# Patient Record
Sex: Male | Born: 1950 | Race: Black or African American | Hispanic: No | Marital: Single | Smoking: Never smoker
Health system: Southern US, Community
[De-identification: ages and names within clinical notes are randomized; demographics above are authoritative.]

## PROBLEM LIST (undated history)

## (undated) DIAGNOSIS — I10 Essential (primary) hypertension: Secondary | ICD-10-CM

---

## 2016-03-30 ENCOUNTER — Encounter (HOSPITAL_COMMUNITY): Payer: Self-pay

## 2016-03-30 ENCOUNTER — Emergency Department (HOSPITAL_COMMUNITY)
Admission: EM | Admit: 2016-03-30 | Discharge: 2016-03-30 | Disposition: A | Discharge status: Home / Self Care | Payer: Self-pay | Attending: Emergency Medicine | Admitting: Emergency Medicine

## 2016-03-30 ENCOUNTER — Emergency Department (HOSPITAL_COMMUNITY): Payer: Self-pay

## 2016-03-30 DIAGNOSIS — Z7982 Long term (current) use of aspirin: Secondary | ICD-10-CM | POA: Insufficient documentation

## 2016-03-30 DIAGNOSIS — I10 Essential (primary) hypertension: Secondary | ICD-10-CM | POA: Insufficient documentation

## 2016-03-30 DIAGNOSIS — N3091 Cystitis, unspecified with hematuria: Secondary | ICD-10-CM | POA: Insufficient documentation

## 2016-03-30 DIAGNOSIS — Z79899 Other long term (current) drug therapy: Secondary | ICD-10-CM | POA: Insufficient documentation

## 2016-03-30 HISTORY — DX: Essential (primary) hypertension: I10

## 2016-03-30 LAB — CBC WITH DIFFERENTIAL/PLATELET
BASOS ABS: 0 10*3/uL (ref 0.0–0.1)
Basophils Relative: 0 %
EOS PCT: 0 %
Eosinophils Absolute: 0.1 10*3/uL (ref 0.0–0.7)
HCT: 43.6 % (ref 39.0–52.0)
Hemoglobin: 15.3 g/dL (ref 13.0–17.0)
LYMPHS ABS: 2.4 10*3/uL (ref 0.7–4.0)
LYMPHS PCT: 11 %
MCH: 32.1 pg (ref 26.0–34.0)
MCHC: 35.1 g/dL (ref 30.0–36.0)
MCV: 91.6 fL (ref 78.0–100.0)
MONO ABS: 2.6 10*3/uL — AB (ref 0.1–1.0)
MONOS PCT: 12 %
Neutro Abs: 16.6 10*3/uL — ABNORMAL HIGH (ref 1.7–7.7)
Neutrophils Relative %: 77 %
PLATELETS: 157 10*3/uL (ref 150–400)
RBC: 4.76 MIL/uL (ref 4.22–5.81)
RDW: 14.2 % (ref 11.5–15.5)
WBC: 21.7 10*3/uL — ABNORMAL HIGH (ref 4.0–10.5)

## 2016-03-30 LAB — URINALYSIS, ROUTINE W REFLEX MICROSCOPIC
Bacteria, UA: NONE SEEN
SQUAMOUS EPITHELIAL / LPF: NONE SEEN

## 2016-03-30 LAB — COMPREHENSIVE METABOLIC PANEL
ALBUMIN: 2.9 g/dL — AB (ref 3.5–5.0)
ALK PHOS: 75 U/L (ref 38–126)
ALT: 33 U/L (ref 17–63)
ANION GAP: 6 (ref 5–15)
AST: 36 U/L (ref 15–41)
BUN: 11 mg/dL (ref 6–20)
CALCIUM: 8.9 mg/dL (ref 8.9–10.3)
CO2: 30 mmol/L (ref 22–32)
CREATININE: 1.39 mg/dL — AB (ref 0.61–1.24)
Chloride: 102 mmol/L (ref 101–111)
GFR calc Af Amer: >60 mL/min (ref >60)
GFR calc non Af Amer: 52 mL/min — ABNORMAL LOW (ref >60)
Glucose, Bld: 108 mg/dL — ABNORMAL HIGH (ref 65–99)
Potassium: 4.5 mmol/L (ref 3.5–5.1)
SODIUM: 138 mmol/L (ref 135–145)
Total Bilirubin: 1.9 mg/dL — ABNORMAL HIGH (ref 0.3–1.2)
Total Protein: 5.8 g/dL — ABNORMAL LOW (ref 6.5–8.1)

## 2016-03-30 LAB — I-STAT CHEM 8, ED
BUN: 12 mg/dL (ref 6–20)
CREATININE: 1.3 mg/dL — AB (ref 0.61–1.24)
Calcium, Ion: 1.14 mmol/L — ABNORMAL LOW (ref 1.15–1.40)
Chloride: 101 mmol/L (ref 101–111)
GLUCOSE: 107 mg/dL — AB (ref 65–99)
HCT: 46 % (ref 39.0–52.0)
HEMOGLOBIN: 15.6 g/dL (ref 13.0–17.0)
POTASSIUM: 4.5 mmol/L (ref 3.5–5.1)
Sodium: 139 mmol/L (ref 135–145)
TCO2: 26 mmol/L (ref 0–100)

## 2016-03-30 MED ORDER — CEPHALEXIN 250 MG PO CAPS: 250.0000 mg | ORAL_CAPSULE | Freq: Four times a day (QID) | ORAL | 0 refills | Status: AC

## 2016-03-30 MED ORDER — DEXTROSE 5 % IV SOLN
1.0000 g | Freq: Once | INTRAVENOUS | Status: AC
  Administered 2016-03-30: 1 g via INTRAVENOUS
  Filled 2016-03-30: qty 10

## 2016-03-30 MED ORDER — IOPAMIDOL (ISOVUE-300) INJECTION 61%
INTRAVENOUS | Status: AC
  Administered 2016-03-30: 100 mL
  Filled 2016-03-30: qty 100

## 2016-03-30 NOTE — ED Notes (Signed)
Patient transported to CT 

## 2016-03-30 NOTE — ED Triage Notes (Signed)
Pt. Here for bleeding from his penis. Pt. sts this started yesterday and is worse when he urinates. He also endorses dysuria. Pt. Also c/o left leg swelling, and left abd. Swelling. Pt. sts he has hx of kidney problems. Pt. Noted to have blood stained to underwear. Pt. Urine bloody as well.

## 2016-03-30 NOTE — Discharge Instructions (Signed)
It was so nice to meet you! You came into the emergency department with blood in your urine. We did a CT scan of your abdomen and pelvis, which showed that your bladder was very inflamed. We think this might be caused by a urinary tract infection. We gave you a dose of IV antibiotics. We will send you home with an antibiotic called Keflex that you should take four times a day for the next 7 days.   We also recommend that you call the [facility] at [phone] to schedule an appointment within the next week.

## 2016-03-30 NOTE — ED Provider Notes (Signed)
[facility]-EMERGENCY DEPT Provider Note   CSN: [id number] Arrival date & time: 03/30/16  0707   History   Chief Complaint Chief Complaint  Patient presents with  . Hematuria    HPI Jeffrey Dalton is a 65 y.o. male with a PMH of HTN and "kidney problems" presenting to the ED with hematuria. He is visiting from [place] and he arrived in the [place] on October 13th. Yesterday afternoon, he was urinating when he had sudden onset burning pain in his penis. He then noticed that he started urinating blood. Since yesterday afternoon, he has urinated blood every 2-3 minutes. He states that the blood is mostly just leaking out on its own. This has never happened to him before. He also has left sided abdominal swelling that has been going on for the last year. He has never seen a doctor for this. His left testicle is also swollen and mildly tender. He also notes bilateral lower extremity edema for the last few months. He feels like his left leg is more swollen that his right. He notes a fever yesterday. He states he found someone on the street who gave him three tablets of Amoxicillin, which he took every 8 hours yesterday. He otherwise takes Clonidine and Coreg for his hypertension. Not on any anticoagulation. He denies urinary retention.  HPI  Past Medical History:  Diagnosis Date  . Hypertension     There are no active problems to display for this patient.   History reviewed. No pertinent surgical history.    Home Medications    Prior to Admission medications   Medication Sig Start Date End Date Taking? Authorizing Provider  aspirin 81 MG chewable tablet Chew 81 mg by mouth daily.   Yes Historical Provider, MD  carvedilol (COREG) 6.25 MG tablet Take 6.25 mg by mouth 2 (two) times daily with a meal.   Yes Historical Provider, MD  cloNIDine (CATAPRES) 0.1 MG tablet Take 0.1 mg by mouth 2 (two) times daily.   Yes Historical Provider, MD  POTASSIUM PO Take 1 tablet by mouth 2 (two) times  daily.   Yes Historical Provider, MD  cephALEXin (KEFLEX) 250 MG capsule Take 1 capsule (250 mg total) by mouth 4 (four) times daily. 03/30/16   Campbell Stall, MD    Family History History reviewed. No pertinent family history.  Social History Social History  Substance Use Topics  . Smoking status: Never Smoker  . Smokeless tobacco: Never Used  . Alcohol use No     Allergies   Patient has no known allergies.   Review of Systems Review of Systems 10 Systems reviewed and are negative for acute change except as noted in the HPI.  Physical Exam Updated Vital Signs BP (!) 162/106   Pulse 83   Temp 98.5 F (36.9 C) (Oral)   Resp 16   Ht 6' 2.02" (1.88 m)   Wt 100 kg   SpO2 99%   BMI 28.29 kg/m   Physical Exam  Constitutional: He is oriented to person, place, and time. He appears well-developed and well-nourished. No distress.  HENT:  Head: Normocephalic and atraumatic.  Nose: Nose normal.  Eyes: Conjunctivae and EOM are normal.  Neck: Normal range of motion. Neck supple. No JVD present.  Cardiovascular: Normal rate and regular rhythm.  Exam reveals friction rub.   No murmur heard. Pulmonary/Chest: Effort normal and breath sounds normal. No respiratory distress.  Abdominal: There is no tenderness. There is no rebound and no guarding.  Left sided  abdominal swelling noted, no masses palpated, abdomen is distended but soft  Genitourinary: Penis normal.  Genitourinary Comments: Left testicle is edematous and mildly tender to palpation, right testicle normal  Musculoskeletal: Normal range of motion.  No CVA tenderness; 2+ pitting edema to the knee bilaterally  Neurological: He is alert and oriented to person, place, and time. No cranial nerve deficit.  Skin: Skin is warm and dry.  Psychiatric: He has a normal mood and affect. His behavior is normal. Thought content normal.  Nursing note and vitals reviewed.    ED Treatments / Results  Labs (all labs ordered are  listed, but only abnormal results are displayed) Labs Reviewed  COMPREHENSIVE METABOLIC PANEL - Abnormal; Notable for the following:       Result Value   Glucose, Bld 108 (*)    Creatinine, Ser 1.39 (*)    Total Protein 5.8 (*)    Albumin 2.9 (*)    Total Bilirubin 1.9 (*)    GFR calc non Af Amer 52 (*)    All other components within normal limits  CBC WITH DIFFERENTIAL/PLATELET - Abnormal; Notable for the following:    WBC 21.7 (*)    Neutro Abs 16.6 (*)    Monocytes Absolute 2.6 (*)    All other components within normal limits  URINALYSIS, ROUTINE W REFLEX MICROSCOPIC - Abnormal; Notable for the following:    Color, Urine RED (*)    APPearance TURBID (*)    Glucose, UA   (*)    Value: TEST NOT REPORTED DUE TO COLOR INTERFERENCE OF URINE PIGMENT   Hgb urine dipstick   (*)    Value: TEST NOT REPORTED DUE TO COLOR INTERFERENCE OF URINE PIGMENT   Bilirubin Urine   (*)    Value: TEST NOT REPORTED DUE TO COLOR INTERFERENCE OF URINE PIGMENT   Ketones, ur   (*)    Value: TEST NOT REPORTED DUE TO COLOR INTERFERENCE OF URINE PIGMENT   Protein, ur   (*)    Value: TEST NOT REPORTED DUE TO COLOR INTERFERENCE OF URINE PIGMENT   Nitrite   (*)    Value: TEST NOT REPORTED DUE TO COLOR INTERFERENCE OF URINE PIGMENT   Leukocytes, UA   (*)    Value: TEST NOT REPORTED DUE TO COLOR INTERFERENCE OF URINE PIGMENT   All other components within normal limits  I-STAT CHEM 8, ED - Abnormal; Notable for the following:    Creatinine, Ser 1.30 (*)    Glucose, Bld 107 (*)    Calcium, Ion 1.14 (*)    All other components within normal limits  URINE CULTURE    EKG  EKG Interpretation None       Radiology Ct Abdomen Pelvis W Contrast  Result Date: 03/30/2016 CLINICAL DATA:  Painful urination and hematuria starting yesterday EXAM: CT ABDOMEN AND PELVIS WITH CONTRAST TECHNIQUE: Multidetector CT imaging of the abdomen and pelvis was performed using the standard protocol following bolus  administration of intravenous contrast. CONTRAST:  100mL ISOVUE-300 IOPAMIDOL (ISOVUE-300) INJECTION 61% COMPARISON:  None. FINDINGS: Lower chest: Lung bases are normal. Hepatobiliary: Enhanced liver shows no focal mass. No calcified gallstones are noted within gallbladder. Pancreas: Enhanced pancreas shows no focal abnormality. No evidence of acute pancreatitis. Spleen: Enhanced spleen is normal. Adrenals/Urinary Tract: No adrenal gland mass. Enhanced kidneys are symmetrical in size. No hydronephrosis or hydroureter. Delayed renal images shows bilateral renal symmetrical excretion. Bilateral visualized proximal ureter is unremarkable. The urinary bladder is under distended. There is abnormal thickening  of urinary bladder wall up to 1.4 cm. Findings highly suspicious for cystitis. Clinical correlation is necessary. Stomach/Bowel: No small bowel obstruction. Mild fluid distended distal small bowel loops with some air-fluid levels without transition point in caliber of small bowel probable mild ileus. No pericecal inflammation. Normal appendix. Some colonic gas noted in mid sigmoid colon. Moderate stool noted within rectum. Some liquid stool noted within right colon. Diarrhea cannot be excluded. Vascular/Lymphatic: No aortic aneurysm. Mild atherosclerotic calcifications of common iliac arteries. No adenopathy Reproductive: Prostate gland and seminal vesicles are unremarkable. No inguinal adenopathy. Other: No ascites or free abdominal air. Musculoskeletal: No destructive bony lesions are noted. Sagittal images of the spine shows mild degenerative changes thoracolumbar spine. Mild disc space flattening with vacuum disc phenomenon and mild posterior disc bulge at L5-S1 level. IMPRESSION: 1. There is abnormal thickening of urinary bladder wall and mild enhancement of mucosa. Findings highly suspicious for cystitis. Clinical correlation is necessary. Limited assessment of urinary bladder which is under distended. 2. No  hydronephrosis or hydroureter. 3. Mild distended distal small bowel loops with some air-fluid level without transition point in caliber small bowel. Ileus or enteritis cannot be excluded. Some liquid stool noted in right colon suspicious for diarrhea. 4. No pericecal inflammation.  Normal appendix. 5. Some colonic gas noted in mid sigmoid colon. Moderate stool noted within rectum. 6. Degenerative changes lumbar spine. Electronically Signed   By: Natasha MeadLiviu  Pop M.D.   On: 03/30/2016 10:35    Procedures Procedures (including critical care time)  Medications Ordered in ED Medications  iopamidol (ISOVUE-300) 61 % injection (100 mLs  Contrast Given 03/30/16 1008)  cefTRIAXone (ROCEPHIN) 1 g in dextrose 5 % 50 mL IVPB (1 g Intravenous New Bag/Given 03/30/16 1113)     Initial Impression / Assessment and Plan / ED Course  I have reviewed the triage vital signs and the nursing notes.  Pertinent labs & imaging results that were available during my care of the patient were reviewed by me and considered in my medical decision making (see chart for details).  Clinical Course    Darin Engelsbraham is a 65 year old male with a PMH of HTN and "kidney problems" presenting to the ED with sudden onset hematuria and dysuria starting yesterday. He also has a left sided abdominal swelling for the last year and bilateral lower extremity edema for the last few months. Differential includes UTI, bladder malignancy, intrinsic kidney disease, PCKD. Think kidney stone is unlikely without any abdominal pain. He could have something unusual such as Schistosomiasis, although this is more common in areas such as [place] and the [place]. Another differential is sickle cell disease which can lead to papillary necrosis and hematuria, although Pt denies a history of this. Will start with basic lab work including CBC, CMP, UA and a CT abdomen/pelvis to evaluate further.  9:15AM: Initial labs with WBC 21, Hgb 15.3, Cr 1.39, UA with TNTC WBC  and RBCs (other values not reportable because urine too bloody). Will order CT abdomen/pelvis with contrast.  10:45AM: CT abd/pevis with abnormal thickening of the urinary bladder wall, highly suspicious for cystitis; no hydronephrosis; mild distended distal small bowel loops with some air-fluid level may be ileus or enteritis. Will send a urine culture and give Rocephin 1g x 1. Think this is likely infectious hemorrhagic cystitis. Hemoglobin is stable. Pt is able to urinate without difficulty. Patient is safe for discharge home with a course of Keflex and Urology follow-up.  Final Clinical Impressions(s) / ED Diagnoses  Final diagnoses:  Hemorrhagic cystitis    New Prescriptions New Prescriptions   CEPHALEXIN (KEFLEX) 250 MG CAPSULE    Take 1 capsule (250 mg total) by mouth 4 (four) times daily.     Campbell Stall, MD 03/30/16 1154    Cathren Laine, MD 03/30/16 [phone]

## 2016-03-30 NOTE — ED Notes (Signed)
EDP at bedside  

## 2016-04-01 LAB — URINE CULTURE

## 2016-04-02 ENCOUNTER — Telehealth (HOSPITAL_BASED_OUTPATIENT_CLINIC_OR_DEPARTMENT_OTHER): Payer: Self-pay | Admitting: Emergency Medicine

## 2016-04-02 NOTE — Telephone Encounter (Signed)
Post ED Visit - Positive Culture Follow-up: Successful Patient Follow-Up  Culture assessed and recommendations reviewed by: [x]  Enzo BiNathan Batchelder, Pharm.D. []  Celedonio MiyamotoJeremy Frens, Pharm.D., BCPS []  Garvin FilaMike Maccia, Pharm.D. []  Georgina PillionElizabeth Martin, Pharm.D., BCPS []  BridgmanMinh Pham, [address]Pharm[address]D., BCPS, AAHIVP []  Estella HuskMichelle Turner, Pharm.D., BCPS, AAHIVP []  Tennis Mustassie Stewart, Pharm.D. []  Sherle Poeob Vincent, [address]Pharm[address]  Positive urine culture  []  Patient discharged without antimicrobial prescription and treatment is now indicated [x]  Organism is resistant to prescribed ED discharge antimicrobial []  Patient with positive blood cultures  Changes discussed with ED provider: Terance HartKelly Gekas [facility] New antibiotic prescription stop Keflex. Start Nitrofurantoin 100mg  po bid x 7 days [facility] [address]  Contacted patient, 04/02/2016 1042   Berle MullMiller, Mamie Hundertmark 04/02/2016, 10:40 AM

## 2016-04-02 NOTE — Progress Notes (Signed)
ED Antimicrobial Stewardship Positive Culture Follow Up   Jeffrey Dalton is an [age] y.o. male who [facility] with a chief complaint of  Chief Complaint  Patient presents with  . Hematuria    Recent Results (from the past 720 hour(s))  Urine culture     Status: Abnormal   Collection Time: 03/30/16  8:05 AM  Result Value Ref Range Status   Specimen Description URINE, CLEAN CATCH  Final   Special Requests NONE  Final   Culture >=100,000 COLONIES/mL ESCHERICHIA COLI (A)  Final   Report Status 04/01/2016 FINAL  Final   Organism ID, Bacteria ESCHERICHIA COLI (A)  Final      Susceptibility   Escherichia coli - MIC*    AMPICILLIN >=32 RESISTANT Resistant     CEFAZOLIN >=64 RESISTANT Resistant     CEFTRIAXONE <=1 SENSITIVE Sensitive     CIPROFLOXACIN <=0.25 SENSITIVE Sensitive     GENTAMICIN >=16 RESISTANT Resistant     IMIPENEM <=0.25 SENSITIVE Sensitive     NITROFURANTOIN <=16 SENSITIVE Sensitive     TRIMETH/SULFA >=320 RESISTANT Resistant     AMPICILLIN/SULBACTAM >=32 RESISTANT Resistant     PIP/TAZO 8 SENSITIVE Sensitive     Extended ESBL NEGATIVE Sensitive     * >=100,000 COLONIES/mL ESCHERICHIA COLI    [x]  Treated with Keflex, organism resistant to prescribed antimicrobial  New antibiotic prescription: Nitrofurantoin 100mg  PO BID x 7 days  ED Provider: Terance HartKelly Gekas PA-C   Armandina StammerBATCHELDER,Alyx Mcguirk J 04/02/2016, 9:38 AM Infectious Diseases Pharmacist Phone# [phone]

## 2017-11-04 IMAGING — CT CT ABD-PELV W/ CM
2 of 5 series · 15 of 46 positions shown, 17 images · IV contrast (Omni 300)
Comparison: None.

CLINICAL DATA: Painful urination and hematuria starting yesterday

EXAM:
CT ABDOMEN AND PELVIS WITH CONTRAST
TECHNIQUE: Multidetector CT imaging of the abdomen and pelvis was performed
using the standard protocol following bolus administration of
intravenous contrast.
CONTRAST:  100mL [id number] IOPAMIDOL (3I456K-4AA) INJECTION 61%

[Series 2: a/p w/ 5mm · axial · 0.89mm/px · z∈[+911,+1366]mm · 12 of 103 slices shown, 14 images]
[im 6/103  soft-tissue]
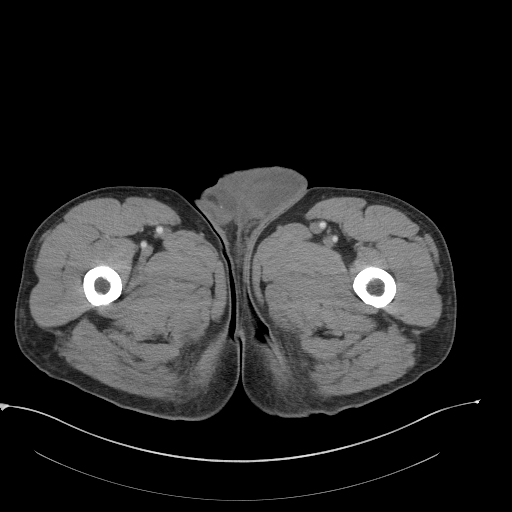
[im 6/103  bone]
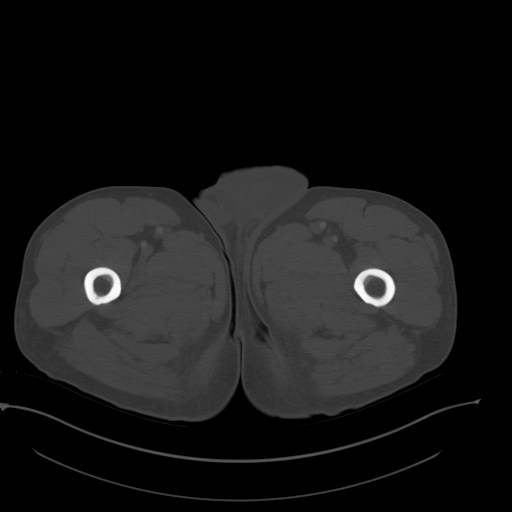
[im 17/103  soft-tissue]
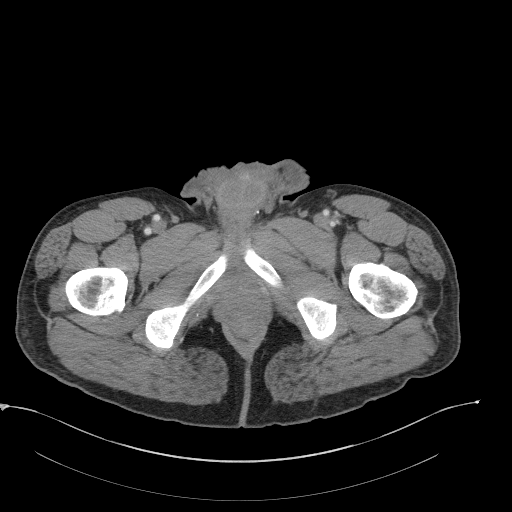
[im 22/103  soft-tissue]
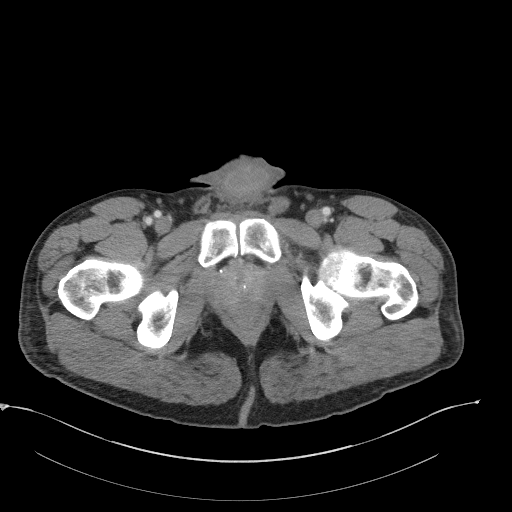
[im 33/103  soft-tissue]
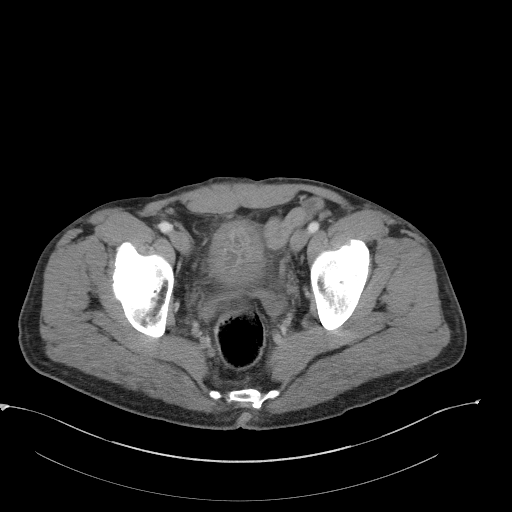
[im 38/103  soft-tissue]
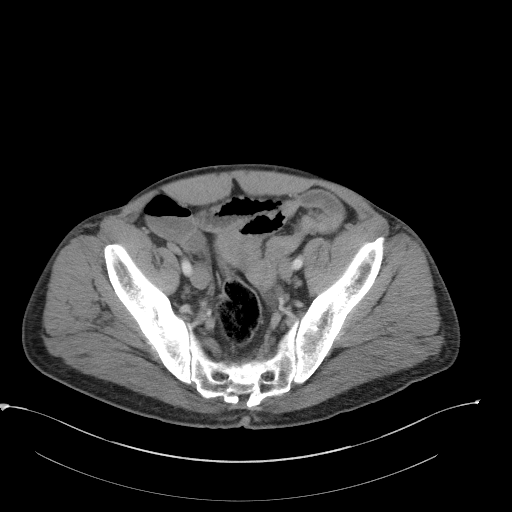
[im 49/103  soft-tissue]
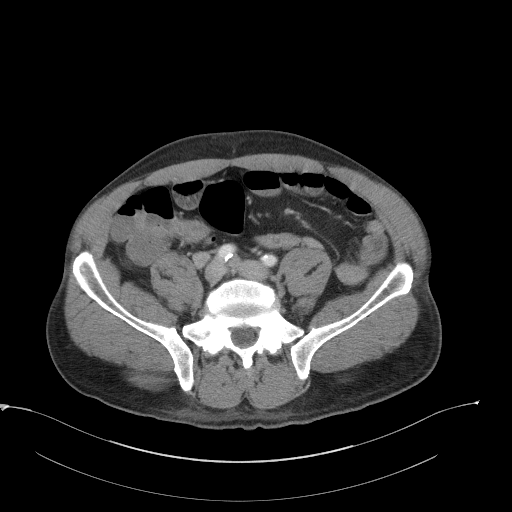
[im 54/103  soft-tissue]
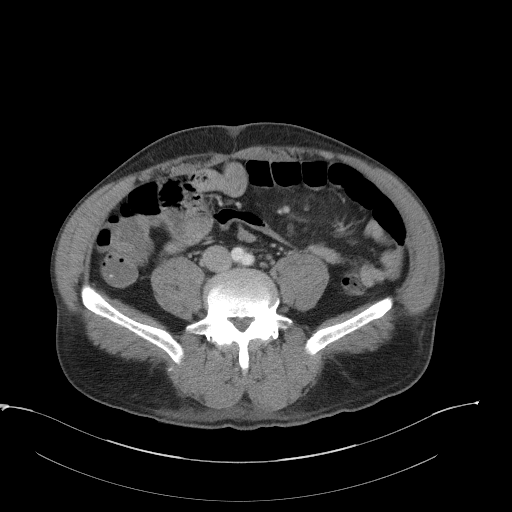
[im 65/103  soft-tissue]
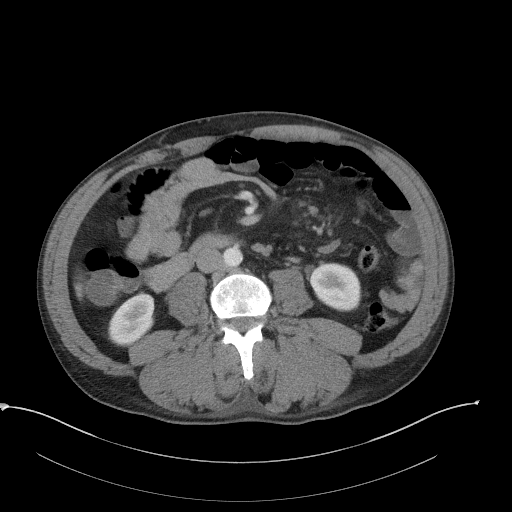
[im 70/103  soft-tissue]
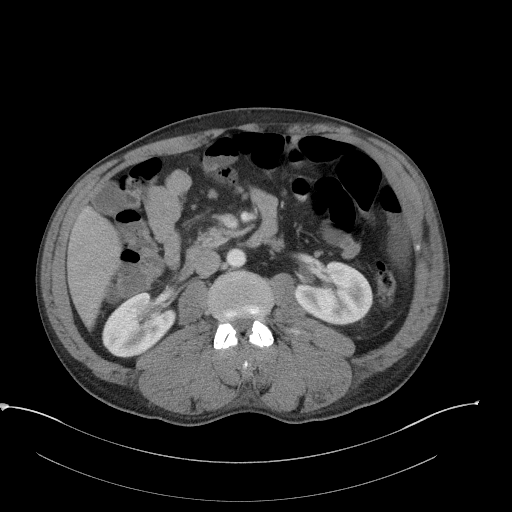
[im 70/103  bone]
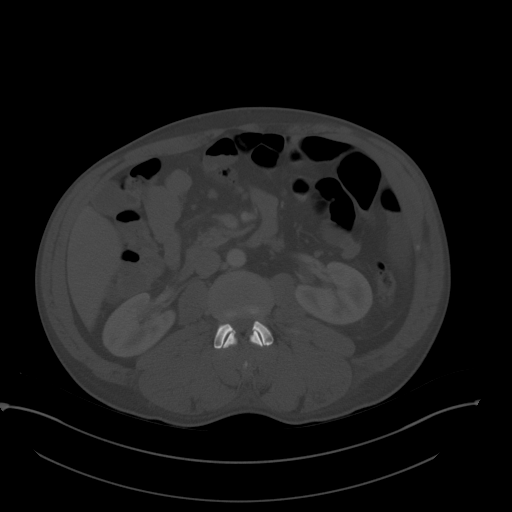
[im 81/103  soft-tissue]
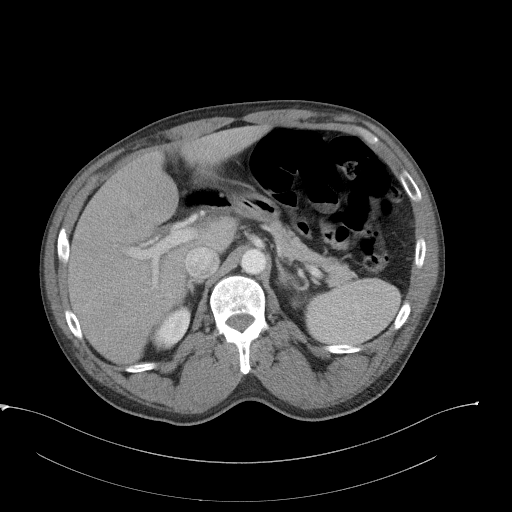
[im 86/103  soft-tissue]
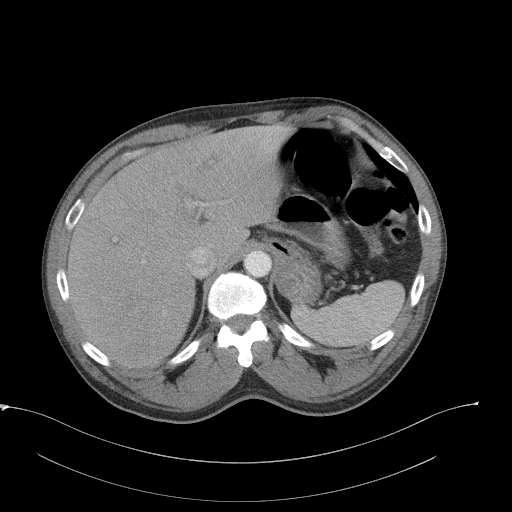
[im 97/103  soft-tissue]
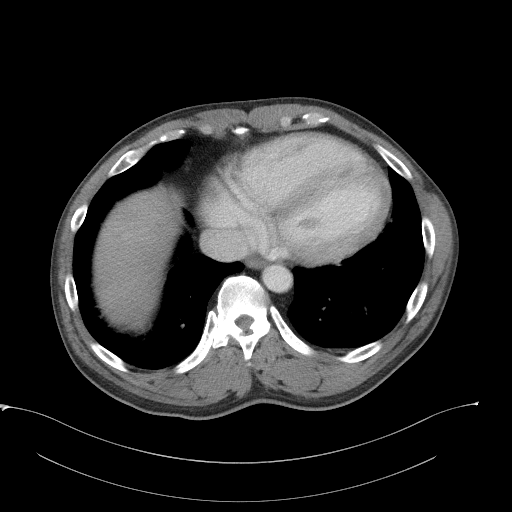

[Series 5: a/p w/ cor · coronal · 0.84mm/px · 3 of 140 slices shown]
[im 47/140  soft-tissue]
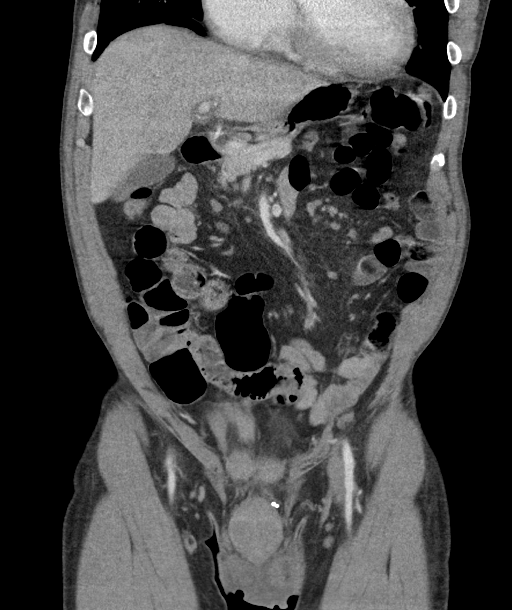
[im 62/140  soft-tissue]
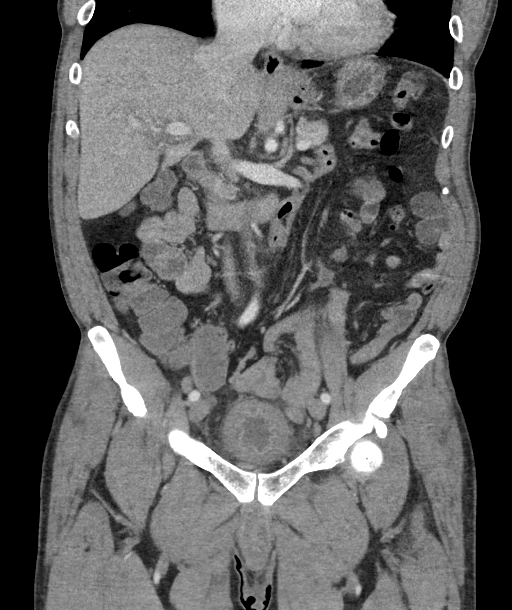
[im 78/140  soft-tissue]
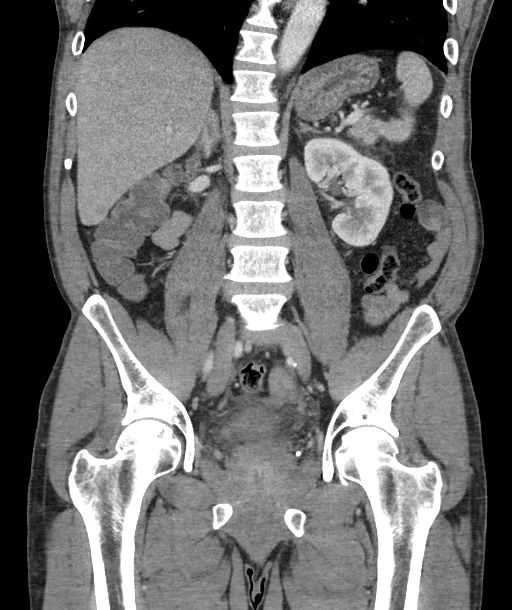

[15 of 46 positions shown; findings below may reference images not displayed]

FINDINGS: Lower chest: Lung bases are normal.

Hepatobiliary: Enhanced liver shows no focal mass. No calcified
gallstones are noted within gallbladder.

Pancreas: Enhanced pancreas shows no focal abnormality. No evidence
of acute pancreatitis.

Spleen: Enhanced spleen is normal.

Adrenals/Urinary Tract: No adrenal gland mass. Enhanced kidneys are
symmetrical in size. No hydronephrosis or hydroureter. Delayed renal
images shows bilateral renal symmetrical excretion. Bilateral
visualized proximal ureter is unremarkable. The urinary bladder is
under distended. There is abnormal thickening of urinary bladder
wall up to 1.4 cm. Findings highly suspicious for cystitis. Clinical
correlation is necessary.

Stomach/Bowel: No small bowel obstruction. Mild fluid distended
distal small bowel loops with some air-fluid levels without
transition point in caliber of small bowel probable mild ileus. No
pericecal inflammation. Normal appendix. Some colonic gas noted in
mid sigmoid colon. Moderate stool noted within rectum. Some liquid
stool noted within right colon. Diarrhea cannot be excluded.

Vascular/Lymphatic: No aortic aneurysm. Mild atherosclerotic
calcifications of common iliac arteries. No adenopathy

Reproductive: Prostate gland and seminal vesicles are unremarkable.
No inguinal adenopathy.

Other: No ascites or free abdominal air.

Musculoskeletal: No destructive bony lesions are noted. Sagittal
images of the spine shows mild degenerative changes thoracolumbar
spine. Mild disc space flattening with vacuum disc phenomenon and
mild posterior disc bulge at L5-S1 level.
IMPRESSION: 1. There is abnormal thickening of urinary bladder wall and mild
enhancement of mucosa. Findings highly suspicious for cystitis.
Clinical correlation is necessary. Limited assessment of urinary
bladder which is under distended.
2. No hydronephrosis or hydroureter.
3. Mild distended distal small bowel loops with some air-fluid level
without transition point in caliber small bowel. Ileus or enteritis
cannot be excluded. Some liquid stool noted in right colon
suspicious for diarrhea.
4. No pericecal inflammation.  Normal appendix.
5. Some colonic gas noted in mid sigmoid colon. Moderate stool noted
within rectum.
6. Degenerative changes lumbar spine.
